# Patient Record
Sex: Male | Born: 1965 | Race: Black or African American | Hispanic: No | Marital: Single
Health system: Midwestern US, Community
[De-identification: ages and names within clinical notes are randomized; demographics above are authoritative.]

## PROBLEM LIST (undated history)

## (undated) DIAGNOSIS — M109 Gout, unspecified: Secondary | ICD-10-CM

## (undated) DIAGNOSIS — M1990 Unspecified osteoarthritis, unspecified site: Secondary | ICD-10-CM

## (undated) DIAGNOSIS — M1A09X0 Idiopathic chronic gout, multiple sites, without tophus (tophi): Secondary | ICD-10-CM

## (undated) DIAGNOSIS — S060XAA Concussion with loss of consciousness status unknown, initial encounter: Secondary | ICD-10-CM

## (undated) DIAGNOSIS — M2570 Osteophyte, unspecified joint: Secondary | ICD-10-CM

## (undated) DIAGNOSIS — M79605 Pain in left leg: Secondary | ICD-10-CM

---

## 2014-06-14 ENCOUNTER — Emergency Department (HOSPITAL_COMMUNITY): Payer: Worker's Compensation

## 2014-06-14 ENCOUNTER — Encounter (HOSPITAL_COMMUNITY): Payer: Self-pay

## 2014-06-14 ENCOUNTER — Emergency Department (HOSPITAL_COMMUNITY)
Admission: EM | Admit: 2014-06-14 | Discharge: 2014-06-14 | Disposition: A | Discharge status: Home / Self Care | Payer: Worker's Compensation | Attending: Emergency Medicine | Admitting: Emergency Medicine

## 2014-06-14 DIAGNOSIS — Y9389 Activity, other specified: Secondary | ICD-10-CM | POA: Insufficient documentation

## 2014-06-14 DIAGNOSIS — S0081XA Abrasion of other part of head, initial encounter: Secondary | ICD-10-CM | POA: Insufficient documentation

## 2014-06-14 DIAGNOSIS — M1990 Unspecified osteoarthritis, unspecified site: Secondary | ICD-10-CM | POA: Diagnosis not present

## 2014-06-14 DIAGNOSIS — T07XXXA Unspecified multiple injuries, initial encounter: Secondary | ICD-10-CM

## 2014-06-14 DIAGNOSIS — Z23 Encounter for immunization: Secondary | ICD-10-CM | POA: Diagnosis not present

## 2014-06-14 DIAGNOSIS — Z79899 Other long term (current) drug therapy: Secondary | ICD-10-CM | POA: Insufficient documentation

## 2014-06-14 DIAGNOSIS — S8992XA Unspecified injury of left lower leg, initial encounter: Secondary | ICD-10-CM | POA: Diagnosis present

## 2014-06-14 DIAGNOSIS — Y92410 Unspecified street and highway as the place of occurrence of the external cause: Secondary | ICD-10-CM | POA: Diagnosis not present

## 2014-06-14 DIAGNOSIS — S60811A Abrasion of right wrist, initial encounter: Secondary | ICD-10-CM | POA: Insufficient documentation

## 2014-06-14 DIAGNOSIS — S0031XA Abrasion of nose, initial encounter: Secondary | ICD-10-CM | POA: Diagnosis not present

## 2014-06-14 DIAGNOSIS — M109 Gout, unspecified: Secondary | ICD-10-CM | POA: Diagnosis not present

## 2014-06-14 DIAGNOSIS — S8002XA Contusion of left knee, initial encounter: Secondary | ICD-10-CM | POA: Diagnosis not present

## 2014-06-14 DIAGNOSIS — Y998 Other external cause status: Secondary | ICD-10-CM | POA: Diagnosis not present

## 2014-06-14 DIAGNOSIS — S0990XA Unspecified injury of head, initial encounter: Secondary | ICD-10-CM | POA: Insufficient documentation

## 2014-06-14 DIAGNOSIS — S0011XA Contusion of right eyelid and periocular area, initial encounter: Secondary | ICD-10-CM | POA: Diagnosis not present

## 2014-06-14 HISTORY — DX: Unspecified osteoarthritis, unspecified site: M19.90

## 2014-06-14 HISTORY — DX: Gout, unspecified: M10.9

## 2014-06-14 MED ORDER — TETANUS-DIPHTH-ACELL PERTUSSIS 5-2.5-18.5 LF-MCG/0.5 IM SUSP
0.5000 mL | Freq: Once | INTRAMUSCULAR | Status: AC
  Administered 2014-06-14: 0.5 mL via INTRAMUSCULAR
  Filled 2014-06-14: qty 0.5

## 2014-06-14 MED ORDER — METHOCARBAMOL 500 MG PO TABS: 500.0000 mg | ORAL_TABLET | Freq: Two times a day (BID) | ORAL | Status: AC

## 2014-06-14 MED ORDER — OXYCODONE-ACETAMINOPHEN 5-325 MG PO TABS: 1.0000 | ORAL_TABLET | ORAL | Status: AC | PRN

## 2014-06-14 MED ORDER — HYDROCODONE-ACETAMINOPHEN 5-325 MG PO TABS
2.0000 | ORAL_TABLET | Freq: Once | ORAL | Status: AC
  Administered 2014-06-14: 2 via ORAL
  Filled 2014-06-14: qty 2

## 2014-06-14 MED ORDER — NAPROXEN 500 MG PO TABS: 500.0000 mg | ORAL_TABLET | Freq: Two times a day (BID) | ORAL | Status: AC
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
## 2014-06-14 NOTE — ED Notes (Signed)
Meal tray ordered 
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
## 2014-07-23 ENCOUNTER — Encounter: Attending: Neurology | Primary: Family Medicine

## 2014-08-29 ENCOUNTER — Encounter

## 2014-09-03 ENCOUNTER — Ambulatory Visit: Payer: Worker's Compensation | Primary: Family Medicine

## 2014-09-05 ENCOUNTER — Inpatient Hospital Stay
Admit: 2014-09-05 | Payer: BLUE CROSS/BLUE SHIELD | Attending: Physical Medicine & Rehabilitation | Primary: Family Medicine

## 2014-09-05 DIAGNOSIS — M79605 Pain in left leg: Secondary | ICD-10-CM

## 2014-09-05 NOTE — Procedures (Signed)
[facility]  *** FINAL REPORT ***    Name: Scott Allen, Scott Allen  MRN: [id number]    Outpatient  DOB: 08-Dec-1965  HIS Order #: [id number]  TRAKnet Visit #: [id number]  Date: 05 Sep 2014    TYPE OF TEST: Peripheral Venous Testing    REASON FOR TEST  Pain in limb, Palpable lump    Left Leg:-  Deep venous thrombosis:           No  Superficial venous thrombosis:    No  Deep venous insufficiency:        No  Superficial venous insufficiency: No      INTERPRETATION/FINDINGS  PROCEDURE:  LEFT LOWER EXTREMITY VENOUS DUPLEX . Evaluation of lower  extremity veins with ultrasound (B-mode imaging, pulsed Doppler, color   Doppler).  Includes the common femoral, deep femoral, femoral,  popliteal, posterior tibial, peroneal, and great saphenous veins.  Other veins, for example the gastrocnemius and soleal veins, may also  be visualized.    FINDINGS: Wallace Cullens scale and color flow duplex images of the veins in the  left lower extremity demonstrate normal compressibility, spontaneous  and augmented flow profiles, and absence of filling defects throughout   the deep and superficial veins in the left lower extremity. In the  left medial knee, a palpable lump is noted.  Under the lump a complex  structure is seen measuring 2.93 x 0.95 cm.  Due to the trauma and  area this could be a probable solid hematoma.    CONCLUSION: Left lower extremity venous duplex negative for deep  venous thrombosis or thrombophlebitis. Complex structure noted  in  left medial knee., resolving hematoma vs organizing Baker's cyst.   Right common femoral vein is thrombus free.    ADDITIONAL COMMENTS    I have personally reviewed the data relevant to the interpretation of  this  study.    TECHNOLOGIST: Delford Field. Nicks, RVT  Signed: 09/05/2014 11:54 AM    PHYSICIAN: Ross Marcus, MD  Signed: 09/05/2014 12:40 PM

## 2014-09-05 NOTE — Procedures (Signed)
[facility]  *** FINAL REPORT ***    Name: Scott Allen, Scott Allen  MRN: [id number]    Outpatient  DOB: 10 May 1965  HIS Order #: [id number]  TRAKnet Visit #: [id number]  Date: 05 Sep 2014    TYPE OF TEST: Peripheral Venous Testing    REASON FOR TEST  Pain in limb, Palpable lump    Left Leg:-  Deep venous thrombosis:           No  Superficial venous thrombosis:    No  Deep venous insufficiency:        No  Superficial venous insufficiency: No      INTERPRETATION/FINDINGS  PROCEDURE:  LEFT LOWER EXTREMITY VENOUS DUPLEX . Evaluation of lower  extremity veins with ultrasound (B-mode imaging, pulsed Doppler, color   Doppler).  Includes the common femoral, deep femoral, femoral,  popliteal, posterior tibial, peroneal, and great saphenous veins.  Other veins, for example the gastrocnemius and soleal veins, may also  be visualized.    FINDINGS: Gray scale and color flow duplex images of the veins in the  left lower extremity demonstrate normal compressibility, spontaneous  and augmented flow profiles, and absence of filling defects throughout   the deep and superficial veins in the left lower extremity. In the  left medial knee, a palpable lump is noted.  Under the lump a complex  structure is seen measuring 2.93 x 0.95 cm.  Due to the trauma and  area this could be a probable solid hematoma.    CONCLUSION: Left lower extremity venous duplex negative for deep  venous thrombosis or thrombophlebitis. Complex structure noted  in  left medial knee., resolving hematoma vs organizing Baker's cyst.   Right common femoral vein is thrombus free.    ADDITIONAL COMMENTS    I have personally reviewed the data relevant to the interpretation of  this  study.    TECHNOLOGIST: Theresa K. Nicks, RVT  Signed: 09/05/2014 11:54 AM    PHYSICIAN: Marcial Pless D. Dawnn Nam, MD  Signed: 09/05/2014 12:40 PM

## 2014-09-24 ENCOUNTER — Encounter

## 2014-09-26 ENCOUNTER — Inpatient Hospital Stay
Admit: 2014-09-26 | Payer: BLUE CROSS/BLUE SHIELD | Attending: Physical Medicine & Rehabilitation | Primary: Family Medicine

## 2014-09-26 DIAGNOSIS — M1712 Unilateral primary osteoarthritis, left knee: Secondary | ICD-10-CM

## 2015-02-03 ENCOUNTER — Ambulatory Visit
Admit: 2015-02-03 | Discharge: 2015-02-03 | Payer: PRIVATE HEALTH INSURANCE | Attending: Family Medicine | Primary: Family Medicine

## 2015-02-03 DIAGNOSIS — M778 Other enthesopathies, not elsewhere classified: Secondary | ICD-10-CM

## 2015-02-03 MED ORDER — DICLOFENAC 75 MG TAB, DELAYED RELEASE
75 mg | ORAL_TABLET | Freq: Two times a day (BID) | ORAL | 1 refills | Status: DC
Start: 2015-02-03 — End: 2016-05-06

## 2015-02-03 MED ORDER — CYCLOBENZAPRINE 5 MG TAB
5 mg | ORAL_TABLET | ORAL | 1 refills | Status: AC
Start: 2015-02-03 — End: ?

## 2015-02-03 NOTE — Progress Notes (Signed)
Scott Allen is a 50 y.o. male   Chief Complaint   Patient presents with   ??? Establish Care   ??? Arm Pain    Pt states he tried to catch himself from a fall to ground not from an elevated position, states was walking and lost his balance and put arm behind him to catch himself.  Pt having pain around deltoid region and is having a hard time abducting his R arm.  Pt had x-rays which were normal.  Pt has gout and has been using indocin with no relief.  States it aches quote a bit at night.  Pt states his Bp is usually well controlled.  Pt has been using motrin prn up to 3 tabs at a time.      Chief Complaint   Patient presents with   ??? Establish Care   ??? Arm Pain     he is a 50 y.o. year old male who presents for evalution.    Reviewed PmHx, RxHx, FmHx, SocHx, AllgHx and updated and dated in the chart.    Review of Systems - negative except as listed above in the HPI    Objective:     Vitals:    02/03/15 0749   BP: (!) 150/98   Pulse: (!) 52   Resp: 18   Temp: 98.2 ??F (36.8 ??C)   TempSrc: Oral   SpO2: 98%   Weight: 246 lb 12.8 oz (111.9 kg)   Height: 6' (1.829 m)       Current Outpatient Prescriptions   Medication Sig   ??? colchicine 0.6 mg tablet Take 0.6 mg by mouth daily.   ??? diclofenac EC (VOLTAREN) 75 mg EC tablet Take 1 Tab by mouth two (2) times a day. As needed   ??? cyclobenzaprine (FLEXERIL) 5 mg tablet 1-2 tabs PO QHS prn muscle pain     No current facility-administered medications for this visit.        Physical Examination: General appearance - alert, well appearing, and in no distress  Eyes - pupils equal and reactive, extraocular eye movements intact  Chest - clear to auscultation, no wheezes, rales or rhonchi, symmetric air entry  Heart - normal rate, regular rhythm, normal S1, S2, no murmurs, rubs, clicks or gallops  Musculoskeletal - ttp over R deltoid, pain with abduction past 30 degrees, unable to lift arm above head      Assessment/ Plan:   Scott Allen was seen today for establish care and arm pain.     Diagnoses and all orders for this visit:    Deltoid tendinitis of right shoulder  -     diclofenac EC (VOLTAREN) 75 mg EC tablet; Take 1 Tab by mouth two (2) times a day. As needed  -     cyclobenzaprine (FLEXERIL) 5 mg tablet; 1-2 tabs PO QHS prn muscle pain  Pt given rehab exercises  Colon cancer screening  -     REFERRAL FOR COLONOSCOPY     Follow-up Disposition:  Return if symptoms worsen or fail to improve.    I have discussed the diagnosis with the patient and the intended plan as seen in the above orders.  The patient has received an after-visit summary and questions were answered concerning future plans. Pt conveyed understanding of plan.    Medication Side Effects and Warnings were discussed with patient      Dietrich Pates, DO

## 2015-02-03 NOTE — Patient Instructions (Signed)
Rotator Cuff: Exercises  Your Care Instructions  Here are some examples of typical rehabilitation exercises for your condition. Start each exercise slowly. Ease off the exercise if you start to have pain.  Your doctor or physical therapist will tell you when you can start these exercises and which ones will work best for you.  How to do the exercises  Pendulum swing    Note: If you have pain in your back, do not do this exercise.  1. Hold on to a table or the back of a chair with your good arm. Then bend forward a little and let your sore arm hang straight down. This exercise does not use the arm muscles. Rather, use your legs and your hips to create movement that makes your arm swing freely.  2. Use the movement from your hips and legs to guide the slightly swinging arm back and forth like a pendulum (or elephant trunk). Then guide it in circles that start small (about the size of a dinner plate). Make the circles a bit larger each day, as your pain allows.  3. Do this exercise for 5 minutes, 5 to 7 times each day.  4. As you have less pain, try bending over a little farther to do this exercise. This will increase the amount of movement at your shoulder.  Posterior stretching exercise    1. Hold the elbow of your injured arm with your other hand.  2. Use your hand to pull your injured arm gently up and across your body. You will feel a gentle stretch across the back of your injured shoulder.  3. Hold for at least 15 to 30 seconds. Then slowly lower your arm.  4. Repeat 2 to 4 times.  Up-the-back stretch    Note: Your doctor or physical therapist may want you to wait to do this stretch until you have regained most of your range of motion and strength. You can do this stretch in different ways. Hold any of these stretches for at least 15 to 30 seconds. Repeat them 2 to 4 times.  1. Put your hand in your back pocket. Let it rest there to stretch your shoulder.   2. With your other hand, hold your injured arm (palm outward) behind your back by the wrist. Pull your arm up gently to stretch your shoulder.  3. Next, put a towel over your other shoulder. Put the hand of your injured arm behind your back. Now hold the back end of the towel. With the other hand, hold the front end of the towel in front of your body. Pull gently on the front end of the towel. This will bring your hand farther up your back to stretch your shoulder.  Overhead stretch    1. Standing about an arm's length away, grasp onto a solid surface. You could use a countertop, a doorknob, or the back of a sturdy chair.  2. With your knees slightly bent, bend forward with your arms straight. Lower your upper body, and let your shoulders stretch.  3. As your shoulders are able to stretch farther, you may need to take a step or two backward.  4. Hold for at least 15 to 30 seconds. Then stand up and relax. If you had stepped back during your stretch, step forward so you can keep your hands on the solid surface.  5. Repeat 2 to 4 times.  Shoulder flexion (lying down)    Note: To make a wand for this exercise,   use a piece of PVC pipe or a broom handle with the broom removed. Make the wand about a foot wider than your shoulders.  1. Lie on your back, holding a wand with both hands. Your palms should face down as you hold the wand.  2. Keeping your elbows straight, slowly raise your arms over your head. Raise them until you feel a stretch in your shoulders, upper back, and chest.  3. Hold for 15 to 30 seconds.  4. Repeat 2 to 4 times.  Shoulder rotation (lying down)    Note: To make a wand for this exercise, use a piece of PVC pipe or a broom handle with the broom removed. Make the wand about a foot wider than your shoulders.  1. Lie on your back. Hold a wand with both hands with your elbows bent and palms up.  2. Keep your elbows close to your body, and move the wand across your body toward the sore arm.   3. Hold for 8 to 12 seconds.  4. Repeat 2 to 4 times.  Wall climbing (to the side)    Note: Avoid any movement that is straight to your side, and be careful not to arch your back. Your arm should stay about 30 degrees to the front of your side.  1. Stand with your side to a wall so that your fingers can just touch it at an angle about 30 degrees toward the front of your body.  2. Walk the fingers of your injured arm up the wall as high as pain permits. Try not to shrug your shoulder up toward your ear as you move your arm up.  3. Hold that position for a count of at least 15 to 20.  4. Walk your fingers back down to the starting position.  5. Repeat at least 2 to 4 times. Try to reach higher each time.  Wall climbing (to the front)    Note: During this stretching exercise, be careful not to arch your back.  1. Face a wall, and stand so your fingers can just touch it.  2. Keeping your shoulder down, walk the fingers of your injured arm up the wall as high as pain permits. (Don't shrug your shoulder up toward your ear.)  3. Hold your arm in that position for at least 15 to 30 seconds.  4. Slowly walk your fingers back down to where you started.  5. Repeat at least 2 to 4 times. Try to reach higher each time.  Shoulder blade squeeze    1. Stand with your arms at your sides, and squeeze your shoulder blades together. Do not raise your shoulders up as you squeeze.  2. Hold 6 seconds.  3. Repeat 8 to 12 times.  Scapular exercise: Arm reach    1. Lie flat on your back. This exercise is a very slight motion that starts with your arms raised (elbows straight, arms straight).  2. From this position, reach higher toward the sky or ceiling. Keep your elbows straight. All motion should be from your shoulder blade only.  3. Relax your arms back to where you started.  4. Repeat 8 to 12 times.  Arm raise to the side    Note: During this strengthening exercise, your arm should stay about 30 degrees to the front of your side.   1. Slowly raise your injured arm to the side, with your thumb facing up. Raise your arm 60 degrees at the most (shoulder level is   90 degrees).  2. Hold the position for 3 to 5 seconds. Then lower your arm back to your side. If you need to, bring your "good" arm across your body and place it under the elbow as you lower your injured arm. Use your good arm to keep your injured arm from dropping down too fast.  3. Repeat 8 to 12 times.  4. When you first start out, don't hold any extra weight in your hand. As you get stronger, you may use a 1-pound to 2-pound dumbbell or a small can of food.  Shoulder flexor and extensor exercise    Note: These are isometric exercises. That means you contract your muscles without actually moving.  ?? Push forward (flex): Stand facing a wall or doorjamb, about 6 inches or less back. Hold your injured arm against your body. Make a closed fist with your thumb on top. Then gently push your hand forward into the wall with about 25% to 50% of your strength. Don't let your body move backward as you push. Hold for about 6 seconds. Relax for a few seconds. Repeat 8 to 12 times.  ?? Push backward (extend): Stand with your back flat against a wall. Your upper arm should be against the wall, with your elbow bent 90 degrees (your hand straight ahead). Push your elbow gently back against the wall with about 25% to 50% of your strength. Don't let your body move forward as you push. Hold for about 6 seconds. Relax for a few seconds. Repeat 8 to 12 times.  Scapular exercise: Wall push-ups    Note: This exercise is best done with your fingers somewhat turned out, rather than straight up and down.  1. Stand facing a wall, about 12 inches to 18 inches away.  2. Place your hands on the wall at shoulder height.  3. Slowly bend your elbows and bring your face to the wall. Keep your back and hips straight.  4. Push back to where you started.  5. Repeat 8 to 12 times.   6. When you can do this exercise against a wall comfortably, you can try it against a counter. You can then slowly progress to the end of a couch, then to a sturdy chair, and finally to the floor.  Scapular exercise: Retraction    Note: For this exercise, you will need elastic exercise material, such as surgical tubing or Thera-Band.  1. Put the band around a solid object at about waist level. (A bedpost will work well.) Each hand should hold an end of the band.  2. With your elbows at your sides and bent to 90 degrees, pull the band back. Your shoulder blades should move toward each other. Then move your arms back where you started.  3. Repeat 8 to 12 times.  4. If you have good range of motion in your shoulders, try this exercise with your arms lifted out to the sides. Keep your elbows at a 90-degree angle. Raise the elastic band up to about shoulder level. Pull the band back to move your shoulder blades toward each other. Then move your arms back where you started.  Internal rotator strengthening exercise    1. Start by tying a piece of elastic exercise material to a doorknob. You can use surgical tubing or Thera-Band.  2. Stand or sit with your shoulder relaxed and your elbow bent 90 degrees. Your upper arm should rest comfortably against your side. Squeeze a rolled towel between your elbow and your   body for comfort. This will help keep your arm at your side.  3. Hold one end of the elastic band in the hand of the painful arm.  4. Slowly rotate your forearm toward your body until it touches your belly. Slowly move it back to where you started.  5. Keep your elbow and upper arm firmly tucked against the towel roll or at your side.  6. Repeat 8 to 12 times.  External rotator strengthening exercise    1. Start by tying a piece of elastic exercise material to a doorknob. You can use surgical tubing or Thera-Band. (You may also hold one end of the band in each hand.)   2. Stand or sit with your shoulder relaxed and your elbow bent 90 degrees. Your upper arm should rest comfortably against your side. Squeeze a rolled towel between your elbow and your body for comfort. This will help keep your arm at your side.  3. Hold one end of the elastic band with the hand of the painful arm.  4. Start with your forearm across your belly. Slowly rotate the forearm out away from your body. Keep your elbow and upper arm tucked against the towel roll or the side of your body until you begin to feel tightness in your shoulder. Slowly move your arm back to where you started.  5. Repeat 8 to 12 times.  Follow-up care is a key part of your treatment and safety. Be sure to make and go to all appointments, and call your doctor if you are having problems. It's also a good idea to know your test results and keep a list of the medicines you take.  Where can you learn more?  Go to http://www.healthwise.net/[facility].  Enter J005 in the search box to learn more about "Rotator Cuff: Exercises."  Current as of: May 26, 2014  Content Version: 11.1  ?? 2006-2016 [employer]. Care instructions adapted under license by [employer] (which disclaims liability or warranty for this information). If you have questions about a medical condition or this instruction, always ask your healthcare professional. [employer] disclaims any warranty or liability for your use of this information.

## 2015-02-03 NOTE — Progress Notes (Signed)
Pt here to est care  Pt states he fell last week and landed on right arm wrong, was seen in ED and had xrays- WNL

## 2015-03-10 ENCOUNTER — Ambulatory Visit
Admit: 2015-03-10 | Discharge: 2015-03-10 | Payer: PRIVATE HEALTH INSURANCE | Attending: Family Medicine | Primary: Family Medicine

## 2015-03-10 DIAGNOSIS — G8929 Other chronic pain: Secondary | ICD-10-CM

## 2015-03-10 NOTE — Progress Notes (Signed)
Scott Allen is a 50 y.o. male   Chief Complaint   Patient presents with   ??? Shoulder Pain     right    pt states that his R shoulder is no better from previous visit, has been doing the exercises and using flexeril and diclofenac which do help some.  Pt did have x-rays with [facility] and there was no Fx.      he is a 50 y.o. year old male who presents for evalution.      Reviewed PmHx, RxHx, FmHx, SocHx, AllgHx and updated and dated in the chart.    Review of Systems - negative except as listed above in the HPI    Objective:     Vitals:    03/10/15 0723   BP: 110/82   Pulse: 83   Resp: 18   Temp: 98.2 ??F (36.8 ??C)   TempSrc: Oral   SpO2: 99%   Weight: 241 lb 6.4 oz (109.5 kg)   Height: 6' (1.829 m)       Current Outpatient Prescriptions   Medication Sig   ??? colchicine 0.6 mg tablet Take 0.6 mg by mouth daily.   ??? diclofenac EC (VOLTAREN) 75 mg EC tablet Take 1 Tab by mouth two (2) times a day. As needed   ??? cyclobenzaprine (FLEXERIL) 5 mg tablet 1-2 tabs PO QHS prn muscle pain     No current facility-administered medications for this visit.        Physical Examination: General appearance - alert, well appearing, and in no distress  Eyes - pupils equal and reactive, extraocular eye movements intact  Chest - clear to auscultation, no wheezes, rales or rhonchi, symmetric air entry  Heart - normal rate, regular rhythm, normal S1, S2, no murmurs, rubs, clicks or gallops  Musculoskeletal - pt unable to abduct R arm past 45 degrees,   Pos hawkins test    Assessment/ Plan:   Merlyn AlbertFred was seen today for shoulder pain.    Diagnoses and all orders for this visit:    Chronic right shoulder pain  -     MRI SHOULDER RT WO CONT; Future  -     REFERRAL TO ORTHOPEDIC SURGERY  -     REFERRAL TO PHYSICAL THERAPY    Tear of right rotator cuff, unspecified tear extent  -     MRI SHOULDER RT WO CONT; Future  -     REFERRAL TO ORTHOPEDIC SURGERY  -     REFERRAL TO PHYSICAL THERAPY      discussed if mri not covered will have to do 6 weeks PT prior to MRI  Follow-up Disposition:  Return if symptoms worsen or fail to improve.    I have discussed the diagnosis with the patient and the intended plan as seen in the above orders.  The patient has received an after-visit summary and questions were answered concerning future plans. Pt conveyed understanding of plan.    Medication Side Effects and Warnings were discussed with patient      Dietrich PatesKeefe H Eather Chaires, DO

## 2015-03-10 NOTE — Patient Instructions (Signed)
Rotator Cuff Rehabilitation  What is rotator cuff rehabilitation?    Rotator cuff rehabilitation is a series of exercises you do after your surgery. It helps you get back your shoulder's range of motion and strength. You will work with your doctor and physical therapist to plan this exercise program. To get the best results, you need to do the exercises correctly and as often as your doctor tells you.  Before you start any exercises, talk with your doctor or physical therapist. It is important that you know exactly how to do the exercises. Stop and call your doctor if you are not sure that you are doing the exercises correctly or if you have any pain. Hearing clicks and pops during exercise is not always cause for concern, but a grinding feeling may mean a more serious problem. Ice your shoulder after exercising if it is sore.  Follow-up care is a key part of your treatment and safety. Be sure to make and go to all appointments, and call your doctor if you are having problems. It's also a good idea to know your test results and keep a list of the medicines you take.  Stretching exercises  Do not start doing stretching exercises until your doctor says you can. Your doctor will tell you which exercises to do, and how often and how long to do them.  Posterior stretch  ?? Stand upright with your feet shoulder-width apart.  ?? Put the hand of your affected arm on the opposite shoulder, and hold the elbow to your body.  ?? Then, using your good arm, hold the elbow of your affected arm and move it gently up, away from, and across your body.  External rotation  ?? Hold a lightweight stick or rod in your good arm. It should be about 2 feet long. A curtain rod may work well.  ?? Lie on your back with your elbows next to your sides. Rest the elbow of your affected arm on a small pillow or folded towel.  ?? Set your arms so that the elbows are bent at a 90-degree angle, like the letter "L." Your hands will point straight up.   ?? Hold the stick with both hands. Use your good arm to push the stick toward the affected arm so the affected arm moves outward, away from your body. Stop when you feel the arm stretching.  Strength exercises  Do not start strength exercises until your doctor says you can. Usually, this is at least 6 to 8 weeks after surgery. Your doctor will tell you how often and how long to do the exercises.  Arm raises to the side  ?? Stand upright with your feet shoulder-width apart and your affected arm at your side.  ?? Slowly raise your injured arm to the side, with your thumb facing up. Raise your arm 60 degrees at the most (shoulder level is 90 degrees).  ?? After holding the position for 3 to 5 seconds, lower your arm back to your side. If you need to, bring your "good" arm across your body and place it under the elbow as you lower your injured arm. Use your good arm to keep your injured arm from dropping down too fast during the downward motion.  ?? Repeat 8 to 12 times.  ?? When you first start out, don't hold any additional weight in your hand. As your strength improves, you may use a 1- to 2-pound dumbbell or a small can of food.  Shoulder flexor  ??   Stand facing a wall. Your body should be about 6 inches away from the wall.  ?? Keep your affected arm and elbow to your side, and bend your elbow so that your arm is pointing toward the wall.  ?? Make a closed fist with your thumb on top.  ?? Push your hand into the wall and hold it for 6 seconds. Push with 25% to 50% of the force you have.  Shoulder extension  ?? Stand with your back flat against a wall.  ?? Keep your affected arm and elbow at your side, and bend your elbow so that your upper arm is against the wall and your lower arm is pointing straight ahead. Make a closed fist with your thumb on top.  ?? Push your elbow gently back against the wall, holding for 6 seconds. Push with 25% to 50% of the force you have.  Where can you learn more?   Go to http://www.healthwise.net/GoodHelpConnections.  Enter L805 in the search box to learn more about "Rotator Cuff Rehabilitation."  Current as of: May 26, 2014  Content Version: 11.1  ?? 2006-2016 [employer]. Care instructions adapted under license by [employer] (which disclaims liability or warranty for this information). If you have questions about a medical condition or this instruction, always ask your healthcare professional. [employer] disclaims any warranty or liability for your use of this information.

## 2015-03-10 NOTE — Progress Notes (Signed)
Pt c/o continued right shoulder pain since last visit, worse with movement and laying

## 2015-03-13 ENCOUNTER — Inpatient Hospital Stay: Admit: 2015-03-13 | Payer: BLUE CROSS/BLUE SHIELD | Attending: Family Medicine | Primary: Family Medicine

## 2015-03-13 DIAGNOSIS — M75121 Complete rotator cuff tear or rupture of right shoulder, not specified as traumatic: Secondary | ICD-10-CM

## 2015-03-13 NOTE — Progress Notes (Signed)
You have complete tearing of 2 of your rotator cuff tendons.  I need you to make a follow up appt with orthopedics, the number for [facility] is [phone]

## 2015-04-14 NOTE — Progress Notes (Signed)
Rec'd medical records request from [employer] [facility], faxed request to [employer] for processing on 04/08/15 confirmation rec'd.

## 2015-09-03 ENCOUNTER — Ambulatory Visit
Admit: 2015-09-03 | Discharge: 2015-09-03 | Payer: PRIVATE HEALTH INSURANCE | Attending: Family Medicine | Primary: Family Medicine

## 2015-09-03 DIAGNOSIS — M109 Gout, unspecified: Secondary | ICD-10-CM

## 2015-09-03 MED ORDER — HYDROCODONE-ACETAMINOPHEN 5 MG-325 MG TAB
5-325 mg | ORAL_TABLET | Freq: Four times a day (QID) | ORAL | 0 refills | Status: DC | PRN
Start: 2015-09-03 — End: 2016-05-06

## 2015-09-03 MED ORDER — PREDNISONE 10 MG TABLETS IN A DOSE PACK
10 mg | ORAL_TABLET | ORAL | 0 refills | Status: DC
Start: 2015-09-03 — End: 2016-05-06

## 2015-09-03 MED ORDER — COLCHICINE 0.6 MG TAB
0.6 mg | ORAL_TABLET | Freq: Every day | ORAL | 2 refills | Status: DC
Start: 2015-09-03 — End: 2015-12-30

## 2015-09-03 NOTE — Patient Instructions (Addendum)
Purine-Restricted Diet: Care Instructions  Your Care Instructions  Purines are substances that are found in some foods. Your body turns purines into uric acid. High levels of uric acid can cause gout, which is a form of arthritis that causes pain and inflammation in joints.  You may be able to help control the amount of uric acid in your body by limiting high-purine foods in your diet.  Follow-up care is a key part of your treatment and safety. Be sure to make and go to all appointments, and call your doctor if you are having problems. It's also a good idea to know your test results and keep a list of the medicines you take.  How can you care for yourself at home?  ?? Plan your meals and snacks around foods that are low in purines and are safe for you to eat. These foods include:  ?? Green vegetables and tomatoes.  ?? Fruits.  ?? Whole-grain breads, rice, and cereals.  ?? Eggs, peanut butter, and nuts.  ?? Low-fat milk, cheese, and other milk products.  ?? Popcorn.  ?? Gelatin desserts, chocolate, cocoa, and cakes and sweets, in small amounts.  ?? You can eat certain foods that are medium-high in purines, but eat them only once in a while. These foods include:  ?? Legumes, such as dried beans and dried peas. You can have 1 cup cooked legumes each day.  ?? Asparagus, cauliflower, spinach, mushrooms, and green peas.  ?? Fish and seafood (other than very high-purine seafood).  ?? Oatmeal, wheat bran, and wheat germ.  ?? Limit very high-purine foods, including:  ?? Organ meats, such as liver, kidneys, sweetbreads, and brains.  ?? Meats, including bacon, beef, pork, and lamb.  ?? Game meats and any other meats in large amounts.  ?? Anchovies, sardines, herring, mackerel, and scallops.  ?? Gravy.  ?? Beer.  Where can you learn more?  Go to http://www.healthwise.net/GoodHelpConnections.  Enter F448 in the search box to learn more about "Purine-Restricted Diet: Care Instructions."  Current as of: July 29, 2014  Content Version: 11.3   ?? 2006-2017 [employer]. Care instructions adapted under license by [employer] (which disclaims liability or warranty for this information). If you have questions about a medical condition or this instruction, always ask your healthcare professional. [employer] disclaims any warranty or liability for your use of this information.

## 2015-09-03 NOTE — Progress Notes (Signed)
Scott Allen is a 50 y.o. male   Chief Complaint   Patient presents with   ??? Gout    pt states that he thinks he is having a gout flare for the past week or so and began in his R ankle and now in first toe,. Has been taking motrin 800 with some relief also has indomethacin and not helping.  Has been out of colcrys.    PMP checked.  he is a 50 y.o. year old male who presents for evalution.      Reviewed PmHx, RxHx, FmHx, SocHx, AllgHx and updated and dated in the chart.    Review of Systems - negative except as listed above in the HPI    Objective:     Vitals:    09/03/15 1503   BP: 122/88   Pulse: 87   Resp: 18   Temp: 98.2 ??F (36.8 ??C)   TempSrc: Oral   SpO2: 99%   Weight: 154 lb (69.9 kg)   Height: 6' (1.829 m)       Current Outpatient Prescriptions   Medication Sig   ??? colchicine 0.6 mg tablet Take 1 Tab by mouth daily.   ??? predniSONE (STERAPRED DS) 10 mg dose pack See administration instruction per 10mg  dose pack   ??? HYDROcodone-acetaminophen (NORCO) 5-325 mg per tablet Take 1 Tab by mouth every six (6) hours as needed for Pain. Max Daily Amount: 4 Tabs.   ??? diclofenac EC (VOLTAREN) 75 mg EC tablet Take 1 Tab by mouth two (2) times a day. As needed   ??? cyclobenzaprine (FLEXERIL) 5 mg tablet 1-2 tabs PO QHS prn muscle pain     No current facility-administered medications for this visit.        Physical Examination: General appearance - alert, well appearing, and in no distress  Eyes - pupils equal and reactive, extraocular eye movements intact  Chest - clear to auscultation, no wheezes, rales or rhonchi, symmetric air entry  Heart - normal rate, regular rhythm, normal S1, S2, no murmurs, rubs, clicks or gallops  Musculoskeletal - hot tender 1st toe joint R foot      Assessment/ Plan:   Diagnoses and all orders for this visit:    1. Podagra  -     colchicine 0.6 mg tablet; Take 1 Tab by mouth daily.  -     predniSONE (STERAPRED DS) 10 mg dose pack; See administration instruction per 10mg  dose pack   -     HYDROcodone-acetaminophen (NORCO) 5-325 mg per tablet; Take 1 Tab by mouth every six (6) hours as needed for Pain. Max Daily Amount: 4 Tabs.    2. Acute idiopathic gout of right foot  -     colchicine 0.6 mg tablet; Take 1 Tab by mouth daily.  -     predniSONE (STERAPRED DS) 10 mg dose pack; See administration instruction per 10mg  dose pack  -     HYDROcodone-acetaminophen (NORCO) 5-325 mg per tablet; Take 1 Tab by mouth every six (6) hours as needed for Pain. Max Daily Amount: 4 Tabs.       Follow-up Disposition:  Return if symptoms worsen or fail to improve.    I have discussed the diagnosis with the patient and the intended plan as seen in the above orders.  The patient has received an after-visit summary and questions were answered concerning future plans. Pt conveyed understanding of plan.    Medication Side Effects and Warnings were discussed with patient  [place], DO

## 2015-09-03 NOTE — Progress Notes (Signed)
Pt c/o gout flair right foot for past week

## 2015-09-11 NOTE — Telephone Encounter (Signed)
Colchicine 0.6mg  sub[facility] to [facility] via cover my meds. Awaiting response.

## 2015-09-14 NOTE — Progress Notes (Signed)
Rec'd request for medical records from [employer],faxed to [employer] on 09/11/15 to be processed

## 2015-10-07 NOTE — Progress Notes (Signed)
Rec'd medical records request from [facility], faxed request to [employer] for processing, confirmation rec'd.

## 2015-12-30 MED ORDER — COLCHICINE 0.6 MG CAPSULE
0.6 mg | ORAL_CAPSULE | Freq: Every day | ORAL | 2 refills | Status: DC
Start: 2015-12-30 — End: 2016-05-17

## 2015-12-30 NOTE — Telephone Encounter (Signed)
-----   Message from Lonn GeorgiaPier D Shelton sent at 12/30/2015 10:26 AM EST -----  Regarding: DO Lobb/Refill  [facility]'s medicine Colchicine be changed to capsules since the patient does not have insurance and the pharmacy can use the discount coupons. The pharmacy number is [phone]

## 2015-12-30 NOTE — Telephone Encounter (Signed)
New Rx sent for caps

## 2016-01-01 NOTE — Telephone Encounter (Signed)
Colchicine 0.6mg  submitted to [employer] ([id number]) via cover my meds. Awaiting response.

## 2016-02-16 ENCOUNTER — Encounter

## 2016-02-18 MED ORDER — NYSTATIN-TRIAMCINOLONE 100,000 UNIT/G-0.1 % TOPICAL CREAM
100000-0.1 unit/g-% | Freq: Two times a day (BID) | CUTANEOUS | 0 refills | Status: AC
Start: 2016-02-18 — End: ?

## 2016-03-02 ENCOUNTER — Ambulatory Visit: Payer: Self-pay | Primary: Family Medicine

## 2016-03-08 ENCOUNTER — Ambulatory Visit: Payer: BLUE CROSS/BLUE SHIELD | Primary: Family Medicine

## 2016-03-08 DIAGNOSIS — S060X9A Concussion with loss of consciousness of unspecified duration, initial encounter: Secondary | ICD-10-CM

## 2016-03-09 ENCOUNTER — Inpatient Hospital Stay: Admit: 2016-03-09 | Payer: PRIVATE HEALTH INSURANCE | Attending: Psychiatry | Primary: Family Medicine

## 2016-05-06 ENCOUNTER — Ambulatory Visit: Admit: 2016-05-06 | Discharge: 2016-05-06 | Attending: Family Medicine | Primary: Family Medicine

## 2016-05-06 DIAGNOSIS — M10079 Idiopathic gout, unspecified ankle and foot: Secondary | ICD-10-CM

## 2016-05-06 MED ORDER — DICLOFENAC 75 MG TAB, DELAYED RELEASE
75 mg | ORAL_TABLET | Freq: Two times a day (BID) | ORAL | 1 refills | Status: AC
Start: 2016-05-06 — End: ?

## 2016-05-06 NOTE — Progress Notes (Signed)
1. Have you been to the ER, urgent care clinic since your last visit?  Hospitalized since your last visit?Yes When: 04/2016 Where: [facility] Reason for visit: Gout    2. Have you seen or consulted any other health care providers outside of the [facility] since your last visit?  Include any pap smears or colon screening. No     Chief Complaint   Patient presents with   ??? Gout     Pt states both feet are aching currently. [id number]+. Pt states aching and pain is continuous.    ??? Shoulder Pain     Pt states right shoulder ROM is limited.

## 2016-05-06 NOTE — Progress Notes (Signed)
Scott Allen is a 51 y.o. male   Chief Complaint   Patient presents with   ??? Gout     Pt states both feet are aching currently. [id number]+. Pt states aching and pain is continuous.    ??? Shoulder Pain     Pt states right shoulder ROM is limited.    pt states that his feet hurt all of the time but the more walking he does the worse it gets.  Once can be worse than the other but varies. Discussed checking labs and possibly starting allopurinol for rpevention of gout flares.  Pt does not feel he is having a flare at this time and pt does have colchicine.    Pt also did have his shoulder surger ybut remains with limited mobility of his R arm.      he is a 51 y.o. year old male who presents for evalution.      Reviewed PmHx, RxHx, FmHx, SocHx, AllgHx and updated and dated in the chart.    Review of Systems - negative except as listed above in the HPI    Objective:     Vitals:    05/06/16 1008   BP: 123/82   Pulse: 69   Resp: 16   Temp: 98.5 ??F (36.9 ??C)   TempSrc: Oral   SpO2: 98%   Weight: 264 lb 12.8 oz (120.1 kg)   Height: 6' (1.829 m)       Current Outpatient Prescriptions   Medication Sig   ??? butalbital-acetaminophen-caffeine (FIORICET, ESGIC) 50-325-40 mg per tablet TAKE 1-2 TAB BY MOUTH EVERY 4 HOURS AS NEEDED FOR HEADACHE   ??? prazosin (MINIPRESS) 2 mg capsule TAKE ONE CAPSULE BY MOUTH EVERY NIGHT AT BEDTIME   ??? sertraline (ZOLOFT) 100 mg tablet TAKE ONE TABLET BY MOUTH EVERY DAY   ??? busPIRone (BUSPAR) 5 mg tablet Take  by mouth.   ??? diclofenac EC (VOLTAREN) 75 mg EC tablet Take 1 Tab by mouth two (2) times a day. As needed   ??? colchicine (MITIGARE) 0.6 mg capsule Take 1 Cap by mouth daily.   ??? nystatin-triamcinolone (MYCOLOG II) topical cream Apply  to affected area two (2) times a day.   ??? cyclobenzaprine (FLEXERIL) 5 mg tablet 1-2 tabs PO QHS prn muscle pain     No current facility-administered medications for this visit.        Physical Examination: General appearance - alert, well appearing, and in no distress   Chest - clear to auscultation, no wheezes, rales or rhonchi, symmetric air entry  Heart - normal rate, regular rhythm, normal S1, S2, no murmurs, rubs, clicks or gallops  Musculoskeletal - bony tenderness b/l feet with warmth or erythema, decreased active ROM R arm at shoulder max abduction approx 80-85 degrees      Assessment/ Plan:   Diagnoses and all orders for this visit:    1. Idiopathic gout of foot, unspecified chronicity, unspecified laterality  -     URIC ACID  -     METABOLIC PANEL, BASIC  -     diclofenac EC (VOLTAREN) 75 mg EC tablet; Take 1 Tab by mouth two (2) times a day. As needed    2. Bilateral foot pain  -     XR FOOT LT MIN 3 V; Future  -     XR FOOT RT MIN 3 V; Future  -     diclofenac EC (VOLTAREN) 75 mg EC tablet; Take 1 Tab by mouth two (2) times  a day. As needed    3. Deltoid tendinitis of right shoulder  Likely will not see any further improvement post op     Follow-up Disposition:  Return if symptoms worsen or fail to improve.    I have discussed the diagnosis with the patient and the intended plan as seen in the above orders.  The patient has received an after-visit summary and questions were answered concerning future plans. Pt conveyed understanding of plan.    Medication Side Effects and Warnings were discussed with patient      Dietrich Pates, DO

## 2016-05-06 NOTE — Patient Instructions (Addendum)
Allopurinol (By mouth)   Allopurinol (al-oh-PURE-i-nol)  Treats gout and kidney stones. Lowers the amount of uric acid in the blood.   Brand Name(s): Zyloprim   There may be other brand names for this medicine.  When This Medicine Should Not Be Used:   This medicine is not right for everyone. Do not use it if you had an allergic reaction to allopurinol.  How to Use This Medicine:   Tablet, Capsule  ?? Your doctor will tell you how much medicine to use. Do not use more than directed.  ?? Keep taking this medicine, even if you think it is not working and you are taking other medicines for gout attacks. The attacks should become shorter and less severe after you take allopurinol for several months.  ?? Drink 10 to 12 full glasses of water each day unless directed differently by your doctor.  ?? You may take allopurinol after meals to prevent stomach upset.  ?? Missed dose: Take a dose as soon as you remember. If it is almost time for your next dose, wait until then and take a regular dose. Do not take extra medicine to make up for a missed dose.  ?? Store the medicine in a closed container at room temperature, away from heat, moisture, and direct light.  Drugs and Foods to Avoid:   Ask your doctor or pharmacist before using any other medicine, including over-the-counter medicines, vitamins, and herbal products.  ?? Some medicines and foods can affect how allopurinol works. Tell your doctor if you are taking any of the following:   ?? A blood thinner, such as warfarin or dicumarol  ?? A diuretic (water pill)  ?? Ampicillin, amoxicillin, cyclosporine  ?? Mercaptopurine, azathioprine  ?? Sulfinpyrazone  ?? Do not take large doses of vitamin C while you are taking allopurinol.  Warnings While Using This Medicine:   ?? Tell your doctor if you are pregnant or breastfeeding, or you have diabetes, high blood pressure, heart failure, seizures, kidney disease, liver disease, cancer, or other medical problems.   ?? This medicine may make you drowsy. Do not drive or do anything that could be dangerous until you know how this medicine affects you.  ?? Keep all medicine out of the reach of children. Never share your medicine with anyone.  Possible Side Effects While Using This Medicine:   Call your doctor right away if you notice any of these side effects:  ?? Allergic reaction: Itching or hives, swelling in your face or hands, swelling or tingling in your mouth or throat, chest tightness, trouble breathing  ?? Blistering, peeling, red skin rash  ?? Eye irritation  ?? Joint pain or muscle aches  ?? Pain when you urinate, blood in your urine  ?? Yellow skin or eyes  If you notice these less serious side effects, talk with your doctor:   ?? Drowsiness  ?? Nausea, vomiting, or diarrhea  If you notice other side effects that you think are caused by this medicine, tell your doctor.   Call your doctor for medical advice about side effects. You may report side effects to FDA at 1-800-FDA-1088  ?? 2017 [employer] Information is for End User's use only and may not be sold, redistributed or otherwise used for commercial purposes.  The above information is an educational aid only. It is not intended as medical advice for individual conditions or treatments. Talk to your doctor, nurse or pharmacist before following any medical regimen to see if   it is safe and effective for you.

## 2016-05-07 LAB — METABOLIC PANEL, BASIC
BUN/Creatinine ratio: 14 (ref 9–20)
BUN: 16 mg/dL (ref 6–24)
CO2: 21 mmol/L (ref 18–29)
Calcium: 9.4 mg/dL (ref 8.7–10.2)
Chloride: 102 mmol/L (ref 96–106)
Creatinine: 1.12 mg/dL (ref 0.76–1.27)
GFR est AA: 87 mL/min/{1.73_m2} (ref >59)
GFR est non-AA: 76 mL/min/{1.73_m2} (ref >59)
Glucose: 101 mg/dL — ABNORMAL HIGH (ref 65–99)
Potassium: 4.3 mmol/L (ref 3.5–5.2)
Sodium: 142 mmol/L (ref 134–144)

## 2016-05-07 LAB — URIC ACID: Uric acid: 9.2 mg/dL — ABNORMAL HIGH (ref 3.7–8.6)

## 2016-05-09 ENCOUNTER — Inpatient Hospital Stay: Admit: 2016-05-09 | Payer: Self-pay | Attending: Family Medicine | Primary: Family Medicine

## 2016-05-09 DIAGNOSIS — M79672 Pain in left foot: Secondary | ICD-10-CM

## 2016-05-09 DIAGNOSIS — M79671 Pain in right foot: Secondary | ICD-10-CM

## 2016-05-12 ENCOUNTER — Encounter

## 2016-05-12 MED ORDER — ALLOPURINOL 100 MG TAB
100 mg | ORAL_TABLET | Freq: Every day | ORAL | 2 refills | Status: DC
Start: 2016-05-12 — End: 2016-08-13

## 2016-05-12 NOTE — Progress Notes (Signed)
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

## 2016-05-12 NOTE — Progress Notes (Signed)
Your uric acid level is quite high so I just sent in allopurinol 2 tabs by mouth daily.  We will need to recheck your levels in 8-10 weeks.  When starting the allopurinol it mobilizes the uric acid so it can sometimes cause a gout flare.

## 2016-05-12 NOTE — Progress Notes (Signed)
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

## 2016-05-17 MED ORDER — COLCHICINE 0.6 MG CAPSULE
0.6 mg | ORAL_CAPSULE | Freq: Every day | ORAL | 3 refills | Status: DC
Start: 2016-05-17 — End: 2017-12-12

## 2016-06-14 NOTE — Progress Notes (Signed)
[employer] request for medical records was faxed to [employer] [phone] to be processed.

## 2016-08-13 ENCOUNTER — Encounter

## 2016-08-17 MED ORDER — ALLOPURINOL 100 MG TAB
100 mg | ORAL_TABLET | ORAL | 2 refills | Status: DC
Start: 2016-08-17 — End: 2017-12-12

## 2016-10-31 NOTE — Progress Notes (Signed)
[facility] fax[facility] to [employer] @ [phone] to be processed on 10/28/16.
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
## 2017-12-12 ENCOUNTER — Telehealth

## 2017-12-13 ENCOUNTER — Encounter

## 2017-12-13 MED ORDER — ALLOPURINOL 100 MG TAB
100 mg | ORAL_TABLET | Freq: Every day | ORAL | 0 refills | Status: AC
Start: 2017-12-13 — End: ?

## 2017-12-13 MED ORDER — ALLOPURINOL 100 MG TAB
100 mg | ORAL_TABLET | Freq: Every day | ORAL | 0 refills | Status: DC
Start: 2017-12-13 — End: 2017-12-13

## 2017-12-13 MED ORDER — COLCHICINE 0.6 MG CAPSULE
0.6 mg | ORAL_CAPSULE | Freq: Every day | ORAL | 0 refills | Status: DC
Start: 2017-12-13 — End: 2017-12-13

## 2017-12-13 MED ORDER — COLCHICINE 0.6 MG TAB
0.6 mg | ORAL_TABLET | Freq: Every day | ORAL | 0 refills | Status: AC
Start: 2017-12-13 — End: ?

## 2017-12-13 NOTE — Telephone Encounter (Signed)
Pharm called. They would like tablets of the colchicine because it is cheaper. Please resend to pharm if possible.

## 2017-12-13 NOTE — Telephone Encounter (Signed)
changed

## 2017-12-13 NOTE — Telephone Encounter (Signed)
Overdue for an appt will not be filled again.  Which [facility] was not specified nor are there any on his chart so [facility] [address]

## 2017-12-13 NOTE — Telephone Encounter (Signed)
Spoke with pt id x3 [facility] [place] [address] [place] [phone] as there is where he lives. Pt states that he is going to call and try and set an apt with a [place] practice closer to where he lives. I verbalized understanding

## 2018-02-28 LAB — HEMOGLOBIN A1C
Estimated Avg Glucose, External: 129 mg/dL — ABNORMAL HIGH (ref 91–123)
Hemoglobin A1C, External: 6.1 % — ABNORMAL HIGH (ref 4.8–5.6)

## 2020-12-03 LAB — HEMOGLOBIN A1C
Estimated Avg Glucose, External: 138 mg/dL — ABNORMAL HIGH (ref 91–123)
Hemoglobin A1C, External: 6.4 % — ABNORMAL HIGH (ref 4.8–5.6)

## 2021-04-06 LAB — HEMOGLOBIN A1C
Estimated Avg Glucose, External: 138 mg/dL — ABNORMAL HIGH (ref 91–123)
Hemoglobin A1C, External: 6.4 % — ABNORMAL HIGH (ref 4.8–5.6)

## 2021-08-08 LAB — HEMOGLOBIN A1C
Estimated Avg Glucose, External: 140 mg/dL — ABNORMAL HIGH (ref 91–123)
Hemoglobin A1C, External: 6.5 % — ABNORMAL HIGH (ref 4.8–5.6)

## 2021-12-30 NOTE — Telephone Encounter (Signed)
Formatting of this note is different from the original.  LAST VISIT:   08/11/2021 (if authorized in last 30 days, please deny as duplicate)  NEXT VISIT:  02/10/2022 (office visit every 6 months unless determined otherwise by provider)  LAST REFILL: 08/11/21    Comprehensive Metabolic Profile  Lab Results   Component Value Date    NA 140 08/07/2021    POTASSIUM 4.5 08/07/2021    CHLORIDE 104 08/07/2021    CO2 25 08/07/2021    ANIONGAP 11.0 08/07/2021    BUN 21 08/07/2021    CREAT 1.3 (H) 08/07/2021    GLUCOSE 94 08/07/2021    CALCIUM 9.4 08/07/2021    ALBUMIN 4.7 08/07/2021    TOTPR 8.0 08/07/2021    ALKPHOS 84 08/07/2021    SGOTAST 24 08/07/2021    SGPTALT 28 08/07/2021    BILIT 0.3 08/07/2021     @  No results found for: "GFRCALC"  No results found for: "GFRRACE"      A1C  Hemoglobin A1c   Date Value Ref Range Status   08/07/2021 6.5 (H) 4.8 - 5.6 % Final     Lipid Complete Panel  Lab Results   Component Value Date/Time    CHOLESTEROL 186 04/05/2021 09:55 AM    HDL 57 04/05/2021 09:55 AM    LDLIPO 105 (H) 04/05/2021 09:55 AM    TRIGLYCERIDE 121 04/05/2021 09:55 AM     Thyroid Studies  Lab Results   Component Value Date/Time    TSH 1.670 04/09/2020 12:00 AM    TSH 2.11 02/27/2018 09:47 AM     Wayland Salinas, LPN   Electronically signed by Wayland Salinas, LPN at 16/10/9602 54:09 AM EST

## 2022-02-03 LAB — HEMOGLOBIN A1C
Estimated Avg Glucose, External: 111 mg/dL (ref 91–123)
Hemoglobin A1C, External: 5.5 % (ref 4.8–5.6)

## 2022-02-03 NOTE — Progress Notes (Signed)
Formatting of this note might be different from the original.  The patient's blood was drawn by venipuncture.  Specimen was processed and sent to the lab.  The patient tolerated the procedure without incident.       Lavender 1, SST  1, Urine Specimen Received 1  were drawn from the patient.    Eber Jones, [facility]  Electronically signed by Eber Jones, TECH at 02/03/2022  8:21 AM EST

## 2022-02-10 NOTE — Progress Notes (Signed)
Formatting of this note is different from the original.  Comprehensive Evaluation- Service Date: 02/10/22  Assessment & Plan   1. (E11.9) Controlled type 2 diabetes mellitus without complication, without long-term current use of insulin (HCC)    Dm:       -Medications: Reviewed, continue ozempic, decrease glipizide        -Denies weight gain, polyuria, polydipsia, LE numbness, tingling or loss of sensation LE.    Lab Results   Component Value Date    HGBA1C 5.5 02/03/2022    HGBA1C 6.5 (H) 08/07/2021    HGBA1C 6.4 (H) 04/05/2021     No results found for: "CREATININE"      -previous visits changes: reviewed    -Discussed medications, side effects with pt and they agreed with plan.    -Medication compliance reinforced     -Diet and physical activity recommended.    -Monitor CBG's at home.    - COMPREHENSIVE METABOLIC PANEL; Future  - HGB A1C WITH EST AVG GLUCOSE; Future  - MICROALB/CREAT RATIO RANDOM URINE; Future  - Lipid Complete Panel; Future  - glipiZIDE XL (GLUCOTROL XL) 5 mg PO TR24; Take 1 Tab by Mouth Once a Day.  Dispense: 90 Tab; Refill: 1    2. Hyperlipidemia associated with type 2 diabetes mellitus (HCC)    HLD:      -Duration of illness: chronic condition       -Controlled       -Current Meds: reviewed     -The 10-year ASCVD risk score (Arnett DK, et al., 2019) is: 26.1%    Values used to calculate the score:      Age: 57 years      Sex: Male      Is Non-Hispanic African American: Yes      Diabetic: Yes      Tobacco smoker: No      Systolic Blood Pressure: 146 mmHg      Is BP treated: Yes      HDL Cholesterol: 70 mg/dL      Total Cholesterol: 255 mg/dL     -  Patient educated on diet high in fiber and low in saturated fat and cholesterol, losing weight, and physically activity.     - COMPREHENSIVE METABOLIC PANEL; Future  - HGB A1C WITH EST AVG GLUCOSE; Future  - MICROALB/CREAT RATIO RANDOM URINE; Future  - Lipid Complete Panel; Future    3. Hypertension associated with diabetes (HCC)    HTN:       -BP at  home      -Duration of illness: chronic condition       -Current Meds: Reviewed       -Medication SE: none      -Associated signs and symptoms: Denies HA, CP, palpitations, vision changes, SOB.      02/10/22 146/84   08/11/21 112/70   08/11/21 111/76     Pulse Readings from Last 3 Encounters:   02/10/22 53   08/11/21 78   08/11/21 61       - poorly Controlled, Increase lisinopril 20 mg      -Discussed medications, side effects with pt and they agreed with plan.    -Medication compliance reinforced     -DASH diet and physical activity recommended.    -Monitor BP at home.    - COMPREHENSIVE METABOLIC PANEL; Future  - HGB A1C WITH EST AVG GLUCOSE; Future  - MICROALB/CREAT RATIO RANDOM URINE; Future  - Lipid Complete Panel; Future  - lisinopriL (  PRINIVIL) 20 mg PO TABS; Take 1 Tab by Mouth Once a Day.  Dispense: 90 Tab; Refill: 3    4. Major depressive disorder in full remission, unspecified whether recurrent ([place])  Better noe.     5. Iron deficiency anemia, unspecified iron deficiency anemia type  Anemia profile  He is taking iron    - CBC WITH DIFFERENTIAL; Future  - IRON & TIBC; Future  - FERRITIN; Future  - FOLATE; Future  - VITAMIN B12; Future    6. Prostate cancer screening    - PSA SCREEN; Future    Red flag symptoms explained, all questions answered, patient understands and agrees with the plan.        Return in about 4 months (around 06/11/2022) for with labs 1 week prior.     Chief Complaint       Patient presents with    DIABETES    HYPERCHOLESTEROLEMIA     History of Present Illness   This is a 57 y.o. male who presents today for f/u on medical conditions, HTN, diabetes and hypertension.    Patient states ozempic has been working good for him.  Diabetes better control, also weight loss around 40 pounds in 6 months.    Denies any complaints today.     Review of Systems   Review of Systems   Constitutional:  Negative for appetite change and fever.   Respiratory:  Negative for shortness of breath.     Cardiovascular:  Negative for chest pain.   Gastrointestinal:  Negative for abdominal pain.   Genitourinary:  Negative for difficulty urinating.   Neurological:  Negative for headaches.     Home Medications     Outpatient Medications Marked as Taking for the 02/10/22 encounter (Office Visit) with Darrold Span, MD   Medication Sig Dispense Refill    albuterol (PROVENTIL, VENTOLIN, PROAIR) 90 mcg/actuation INH HFAA inhaler Take 2 Puffs inhaled by mouth Every 6 Hours As Needed for Wheezing or Shortness of Breath. 18 g 11    allopurinoL (ZYLOPRIM) 100 mg PO TABS take 2 tablets by mouth every day 180 Tab 1    atorvastatin (LIPITOR) 40 mg PO TABS Take 1 Tab by Mouth Every Night at Bedtime. 90 Tab 3    colchicine 0.6 mg PO TABS Take 1 Tab by Mouth Daily as needed. 90 Tab 3    ergocalciferol (VITAMIN D2) 50,000 unit PO CAPS take 1 capsule by mouth every 7 days 8 Cap 0    ferrous sulfate (FEOSOL) 325 mg (65 mg Iron) PO TABS Take 1 Tab by Mouth Once a Day. 30 Tab 5    fluticasone propionate (FLONASE) 50 mcg/actuation NA SpSn 1 Spray by each nostril route Once a Day. 6 mL 1    glipiZIDE XL (GLUCOTROL XL) 5 mg PO TR24 Take 1 Tab by Mouth Once a Day. 90 Tab 1    lisinopriL (PRINIVIL) 20 mg PO TABS Take 1 Tab by Mouth Once a Day. 90 Tab 3    nystatin-triamcinolone (MYCOLOG) 100,000-0.1 unit/g-% Top CREA Use 1 Application  to affected area Twice Daily. 60 g 3    semaglutide (OZEMPIC) 0.25 mg or 0.5 mg(2 mg/1.5 mL) SC Pen Injector Inject 0.5 mg beneath the skin Every 7 Days. 1.5 mL 3    tadalafiL (CIALIS) 20 mg PO TABS Take 1 Tab by Mouth Once a Day. 30 Tab 10     Allergies   No Known Allergies    Past Surgical History  History reviewed. No pertinent surgical history.     Past Medical History     Past Medical History:   Diagnosis Date    Arthropathy     Depression     Diabetes mellitus ([place])     Gout     Hyperlipidemia associated with type 2 diabetes mellitus ([place])      Family History     Family History   Problem Relation Age  of Onset    Cancer Father     Kidney Disease Mother      Social History     Social History     Occupational History    Not on file   Tobacco Use    Smoking status: Never    Smokeless tobacco: Never   Vaping Use    Vaping Use: Never used   Substance and Sexual Activity    Alcohol use: Yes     Comment: rarely    Drug use: Never    Sexual activity: Yes     Partners: Female     Birth control/protection: Condom     E-Cigarette Use: Never User   Start Date:    Quit Date:    Passive Exposure:    Counseling Given:    Comments:    Nicotine:    THC:    CBD:     Flavoring:    Other:       The patient's medical, family, and social history (including tobacco usage) were reviewed and updated as appropriate.    Tobacco History reviewed:  Social History     Tobacco Use   Smoking Status Never   Smokeless Tobacco Never     Counseling given: Not Answered    Physical Exam   Vital Signs: BP 146/84 (Site: Arm L, Position: Sitting, Cuff Size: Medium)   Pulse 53   Temp 97.7 F (36.5 C)   Ht 5\' 11"  (1.803 m)   Wt 96.9 kg (213 lb 9.6 oz)   SpO2 97%   BMI 29.79 kg/m      Physical Exam  Vitals reviewed.   Constitutional:       General: He is not in acute distress.     Appearance: Normal appearance. He is not ill-appearing.   HENT:      Head: Normocephalic.      Mouth/Throat:      Mouth: Mucous membranes are moist.   Cardiovascular:      Rate and Rhythm: Normal rate and regular rhythm.      Heart sounds: Normal heart sounds.   Pulmonary:      Breath sounds: Normal breath sounds.   Abdominal:      General: Bowel sounds are normal.      Palpations: Abdomen is soft.   Musculoskeletal:         General: Normal range of motion.      Cervical back: Neck supple.   Skin:     General: Skin is warm.   Neurological:      Mental Status: He is alert.         Recent Results & Studies   All pertinent labs were reviewed and discussed with patient.         MEDICATION POLICY  As of December 03, 2013, we will no longer accept refill requests from pharmacies  by phone, fax or e-mail. The goal is to renew all medications during office visits. Patients taking long-term medications must be seen at least every 6 months. At this visit, all  prescriptions will be filled with sufficient refills to last until the next scheduled office visit, if requested. Even though we provide a gentle reminder for refills, it is ultimately the patient?s responsibility to be mindful of all expired and empty prescriptions or any prescriptions requiring a renewal. We will allow refill requests directly from patients through the [facility] or by phone, provided the patient has been seen within a year and has a future appointment scheduled. We anticipate these requests will be rare as refill needs will be addressed during your office visit. While we are happy to process any request(s) submitted following your appointment or before your future-dated appointment, please be advised it may take 48 to 72 business hours to fulfill. Furthermore, the refill is ultimately left to the provider?s discretion, and they may require either additional labs, another appointment to discuss your continued need or both. It is important to understand this change enables [place] to provide quality care as medication renewals outside the office visit require much research by the office staff to ensure the medication request is appropriate/accurate. Dosing may also be based on the latest lab results and information obtained during the office visit. If you have any questions or concerns, please don?t hesitate to contact us.    I have reviewed information entered by the clinical staff and/or patient and verified it as accurate or edited where necessary.    Signature:  Darrold Span, MD  [facility]  Dept: [phone]  Dept Fax: [phone]  Electronically signed by Darrold Span, MD at 02/10/2022  8:52 AM EST

## 2022-02-21 NOTE — Telephone Encounter (Signed)
Formatting of this note is different from the original.  LAST VISIT:   02/10/2022 (if authorized in last 30 days, please deny as duplicate)  NEXT VISIT:  06/09/2022 (office visit every 6 months unless determined otherwise by provider)  LAST REFILL:01/09/22    Comprehensive Metabolic Profile  Lab Results   Component Value Date    NA 138 02/03/2022    POTASSIUM 4.3 02/03/2022    CHLORIDE 103 02/03/2022    CO2 27 02/03/2022    ANIONGAP 8.0 02/03/2022    BUN 17 02/03/2022    CREAT 1.2 02/03/2022    GLUCOSE 71 02/03/2022    CALCIUM 9.9 02/03/2022    ALBUMIN 4.7 02/03/2022    TOTPR 7.4 02/03/2022    ALKPHOS 69 02/03/2022    SGOTAST 14 02/03/2022    SGPTALT 14 02/03/2022    BILIT 0.6 02/03/2022     @  No results found for: "GFRCALC"  No results found for: "GFRRACE"      A1C  Hemoglobin A1c   Date Value Ref Range Status   02/03/2022 5.5 4.8 - 5.6 % Final     Lipid Complete Panel  Lab Results   Component Value Date/Time    CHOLESTEROL 255 (H) 02/03/2022 08:06 AM    HDL 70 02/03/2022 08:06 AM    LDLIPO 173 (H) 02/03/2022 08:06 AM    TRIGLYCERIDE 59 02/03/2022 08:06 AM     Thyroid Studies  Lab Results   Component Value Date/Time    TSH 1.87 02/03/2022 08:06 AM    TSH 2.11 02/27/2018 09:47 AM     Scott Allen  Electronically signed by Dionne Milo at 02/21/2022 10:59 AM EST

## 2022-02-21 NOTE — Telephone Encounter (Signed)
Formatting of this note is different from the original.  LAST VISIT:   02/10/2022 (if authorized in last 30 days, please deny as duplicate)  NEXT VISIT:  02/20/2022 (office visit every 6 months unless determined otherwise by provider)  LAST REFILL:10/27/21    Comprehensive Metabolic Profile  Lab Results   Component Value Date    NA 138 02/03/2022    POTASSIUM 4.3 02/03/2022    CHLORIDE 103 02/03/2022    CO2 27 02/03/2022    ANIONGAP 8.0 02/03/2022    BUN 17 02/03/2022    CREAT 1.2 02/03/2022    GLUCOSE 71 02/03/2022    CALCIUM 9.9 02/03/2022    ALBUMIN 4.7 02/03/2022    TOTPR 7.4 02/03/2022    ALKPHOS 69 02/03/2022    SGOTAST 14 02/03/2022    SGPTALT 14 02/03/2022    BILIT 0.6 02/03/2022     @  No results found for: "GFRCALC"  No results found for: "GFRRACE"      A1C  Hemoglobin A1c   Date Value Ref Range Status   02/03/2022 5.5 4.8 - 5.6 % Final     Lipid Complete Panel  Lab Results   Component Value Date/Time    CHOLESTEROL 255 (H) 02/03/2022 08:06 AM    HDL 70 02/03/2022 08:06 AM    LDLIPO 173 (H) 02/03/2022 08:06 AM    TRIGLYCERIDE 59 02/03/2022 08:06 AM     Thyroid Studies  Lab Results   Component Value Date/Time    TSH 1.87 02/03/2022 08:06 AM    TSH 2.11 02/27/2018 09:47 AM     Scott Allen  Electronically signed by Dionne Milo at 02/21/2022 10:26 AM EST

## 2022-03-08 NOTE — Telephone Encounter (Signed)
Formatting of this note might be different from the original.  [employer] called stating pt needs prior Auth. She stated that [facility] isn't contracted with [employer] and they will fax over the prior Auth form. They are requesting that we write "Urgent" at the top to have prior Auth completed so pt can have filled at pharmacy.     Katherene Ponto, CMA   Electronically signed by Odette Horns at 03/08/2022  1:05 PM EST

## 2022-03-18 NOTE — Telephone Encounter (Signed)
Formatting of this note might be different from the original.  Received call from patient regarding his ozempic. Patient was told he needed generic. Called pharmacy and they stated that ozempic is not covered by his insurance and that trulicity will need to be ordered instead    Patient has not had medication for 3 weeks  Electronically signed by Cecille Po at 03/18/2022 11:08 AM EDT

## 2022-03-22 NOTE — Progress Notes (Signed)
Formatting of this note is different from the original.  TELEHEALTH NOTE    Office Visit - Service Date: 03/22/22  Assessment & Plan     1. Controlled type 2 diabetes mellitus without complication, without long-term current use of insulin ([place])    Patient with DM will benefit from continue treatment GLP-1  bydureon  - exenatide microspheres (BYDUREON BCISE) 2 mg/0.85 mL SC AtIn; Inject 2 mg beneath the skin Every 7 Days.  Dispense: 3 mL; Refill: 3    Red flag symptoms explained, all questions answered, patient understands and agrees with the plan.        Return if symptoms worsen or fail to improve.     Chief Complaint       Patient presents with    MEDICATION QUESTION     History of Present Illness     57 y.o. male presents for telehealth visit today with complaints of prescription concerns. Patient stated his pharmacy will not cover Ozempic, need alternative for his DM management.  They suggested bydureon.    Review of Systems   Review of Systems   Constitutional:  Negative for appetite change and fever.   Respiratory:  Negative for shortness of breath.    Cardiovascular:  Negative for chest pain.   Gastrointestinal:  Negative for abdominal pain.   Genitourinary:  Negative for difficulty urinating.   Neurological:  Negative for headaches.     Home Medications     Outpatient Medications Marked as Taking for the 03/22/22 encounter (TeleHealth) with Darrold Span, MD   Medication Sig Dispense Refill    albuterol (PROVENTIL, VENTOLIN, PROAIR) 90 mcg/actuation INH HFAA inhaler Take 2 Puffs inhaled by mouth Every 6 Hours As Needed for Wheezing or Shortness of Breath. 18 g 11    allopurinoL (ZYLOPRIM) 100 mg PO TABS take 2 tablets by mouth every day 180 Tab 1    atorvastatin (LIPITOR) 40 mg PO TABS Take 1 Tab by Mouth Every Night at Bedtime. 90 Tab 3    colchicine 0.6 mg PO TABS Take 1 Tab by Mouth Daily as needed. 90 Tab 3    ergocalciferol (VITAMIN D2) 50,000 unit PO CAPS take 1 capsule by mouth one time per week 8 Cap  0    exenatide microspheres (BYDUREON BCISE) 2 mg/0.85 mL SC AtIn Inject 2 mg beneath the skin Every 7 Days. 3 mL 3    ferrous sulfate (FEOSOL) 325 mg (65 mg Iron) PO TABS Take 1 Tab by Mouth Once a Day. 30 Tab 5    fluticasone propionate (FLONASE) 50 mcg/actuation NA SpSn 1 Spray by each nostril route Once a Day. 6 mL 1    glipiZIDE XL (GLUCOTROL XL) 5 mg PO TR24 Take 1 Tab by Mouth Once a Day. 90 Tab 1    lisinopriL (PRINIVIL) 20 mg PO TABS Take 1 Tab by Mouth Once a Day. 90 Tab 3    nystatin-triamcinolone (MYCOLOG) 100,000-0.1 unit/g-% Top CREA Use 1 Application  to affected area Twice Daily. 60 g 3    tadalafiL (CIALIS) 20 mg PO TABS Take 1 Tab by Mouth Once a Day. 14 Tab 10     Past Histories     The patient's medical, family, and social history (including tobacco usage) were reviewed and updated as appropriate.    Tobacco History reviewed:  Social History     Tobacco Use   Smoking Status Never   Smokeless Tobacco Never     Counseling given: Not Answered  Physical Exam   Vital Signs: There were no vitals taken for this visit.     Physical Exam  Vitals and nursing note reviewed.   Constitutional:       Appearance: Normal appearance.   HENT:      Head: Normocephalic and atraumatic.   Eyes:      Conjunctiva/sclera: Conjunctivae normal.   Pulmonary:      Effort: Pulmonary effort is normal.      Comments: Speaking in full and complete sentences  Musculoskeletal:      Cervical back: Neck supple.      Comments: No notable peripheral or facial edema.   Skin:     Findings: No erythema (Visible) or rash (Visible).      Comments: No rash or lesions on gross exam.   Neurological:      Mental Status: He is alert and oriented to person, place, and time. Mental status is at baseline.   Psychiatric:         Mood and Affect: Mood normal.         Behavior: Behavior normal.     Recent Results & Studies   I have reviewed pertinent labs.        I have reviewed information entered by the clinical staff and/or patient and verified  it as accurate or edited where necessary.    Signature:  Darrold Span, MD  [facility]  Dept: [phone]  Dept Fax: [phone]    Provider Location: Office              Name of Location: [facility] [place]: [place], [place] Patient Location: Home  Name of Location:   [phone] [facility] [address] [place] [id number]      MEDICATION POLICY  As of December 03, 2013, we will no longer accept refill requests from pharmacies by phone, fax or e-mail. The goal is to renew all medications during office visits. Patients taking long-term medications must be seen at least every 6 months. At this visit, all prescriptions will be filled with sufficient refills to last until the next scheduled office visit, if requested. Even though we provide a gentle reminder for refills, it is ultimately the patient?s responsibility to be mindful of all expired and empty prescriptions or any prescriptions requiring a renewal. We will allow refill requests directly from patients through the [facility] or by phone, provided the patient has been seen within a year and has a future appointment scheduled. We anticipate these requests will be rare as refill needs will be addressed during your office visit. While we are happy to process any request(s) submitted following your appointment or before your future-dated appointment, please be advised it may take 48 to 72 business hours to fulfill. Furthermore, the refill is ultimately left to the provider?s discretion, and they may require either additional labs, another appointment to discuss your continued need or both. It is important to understand this change enables [place] to provide quality care as medication renewals outside the office visit require much research by the office staff to ensure the medication request is appropriate/accurate. Dosing may also be based on the latest lab results and  information obtained during the office visit. If you have any questions or concerns, please don?t hesitate to contact us.    Consent for Electronic Treatment:   This visit was conducted with the use of an interactive audio and/or video telecommunications system that  permits real-time communication between the patient and this provider. The patient has submitted their consent to be treated electronically by way of this video technology.  The risks and limitations of the process of telemedicine have been conveyed verbally during this encounter.  Electronically signed by Darrold Span, MD at 03/22/2022 11:29 AM EDT

## 2022-03-22 NOTE — Telephone Encounter (Signed)
Formatting of this note is different from the original.  LAST VISIT:   02/10/2022 (if authorized in last 30 days, please deny as duplicate)  NEXT VISIT:  03/22/2022 (office visit every 6 months unless determined otherwise by provider)  LAST REFILL:08/11/21    Comprehensive Metabolic Profile  Lab Results   Component Value Date    NA 138 02/03/2022    POTASSIUM 4.3 02/03/2022    CHLORIDE 103 02/03/2022    CO2 27 02/03/2022    ANIONGAP 8.0 02/03/2022    BUN 17 02/03/2022    CREAT 1.2 02/03/2022    GLUCOSE 71 02/03/2022    CALCIUM 9.9 02/03/2022    ALBUMIN 4.7 02/03/2022    TOTPR 7.4 02/03/2022    ALKPHOS 69 02/03/2022    SGOTAST 14 02/03/2022    SGPTALT 14 02/03/2022    BILIT 0.6 02/03/2022     @  No results found for: "GFRCALC"  No results found for: "GFRRACE"      A1C  Hemoglobin A1c   Date Value Ref Range Status   02/03/2022 5.5 4.8 - 5.6 % Final     Lipid Complete Panel  Lab Results   Component Value Date/Time    CHOLESTEROL 255 (H) 02/03/2022 08:06 AM    HDL 70 02/03/2022 08:06 AM    LDLIPO 173 (H) 02/03/2022 08:06 AM    TRIGLYCERIDE 59 02/03/2022 08:06 AM     Thyroid Studies  Lab Results   Component Value Date/Time    TSH 1.87 02/03/2022 08:06 AM    TSH 2.11 02/27/2018 09:47 AM     Lesly Rubenstein Eugenio Hoes  Electronically signed by Dionne Milo at 03/22/2022  2:31 PM EDT

## 2022-11-29 LAB — HEMOGLOBIN A1C
Estimated Avg Glucose, External: 142 mg/dL — ABNORMAL HIGH (ref 91–123)
Hemoglobin A1C, External: 6.6 % — ABNORMAL HIGH (ref 4.8–5.6)

## 2022-12-01 NOTE — Telephone Encounter (Signed)
 Med refill
